# Patient Record
Sex: Male | Born: 1990 | Race: Black or African American | Hispanic: No | Marital: Single | Smoking: Current every day smoker
Health system: Southern US, Community
[De-identification: ages and names within clinical notes are randomized; demographics above are authoritative.]

---
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
## 2012-12-06 ENCOUNTER — Encounter (HOSPITAL_COMMUNITY): Payer: Self-pay | Admitting: *Deleted

## 2012-12-06 ENCOUNTER — Emergency Department (HOSPITAL_COMMUNITY): Payer: No Typology Code available for payment source

## 2012-12-06 ENCOUNTER — Emergency Department (HOSPITAL_COMMUNITY)
Admission: EM | Admit: 2012-12-06 | Discharge: 2012-12-06 | Disposition: A | Discharge status: Home / Self Care | Payer: Self-pay | Attending: Emergency Medicine | Admitting: Emergency Medicine

## 2012-12-06 DIAGNOSIS — Y9389 Activity, other specified: Secondary | ICD-10-CM | POA: Insufficient documentation

## 2012-12-06 DIAGNOSIS — Y92410 Unspecified street and highway as the place of occurrence of the external cause: Secondary | ICD-10-CM | POA: Insufficient documentation

## 2012-12-06 DIAGNOSIS — S8392XA Sprain of unspecified site of left knee, initial encounter: Secondary | ICD-10-CM

## 2012-12-06 DIAGNOSIS — IMO0002 Reserved for concepts with insufficient information to code with codable children: Secondary | ICD-10-CM | POA: Insufficient documentation

## 2012-12-06 MED ORDER — NAPROXEN 500 MG PO TABS: 500.0000 mg | ORAL_TABLET | Freq: Two times a day (BID) | ORAL | Status: AC
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
## 2012-12-07 NOTE — ED Provider Notes (Signed)
Medical screening examination/treatment/procedure(s) were performed by non-physician practitioner and as supervising physician I was immediately available for consultation/collaboration.   Shelda Jakes, MD 12/07/12 [id number]

## 2014-07-05 ENCOUNTER — Emergency Department (HOSPITAL_COMMUNITY)
Admission: EM | Admit: 2014-07-05 | Discharge: 2014-07-05 | Disposition: A | Discharge status: Home / Self Care | Payer: Self-pay | Attending: Emergency Medicine | Admitting: Emergency Medicine

## 2014-07-05 ENCOUNTER — Encounter (HOSPITAL_COMMUNITY): Payer: Self-pay

## 2014-07-05 DIAGNOSIS — Z791 Long term (current) use of non-steroidal anti-inflammatories (NSAID): Secondary | ICD-10-CM | POA: Insufficient documentation

## 2014-07-05 DIAGNOSIS — R369 Urethral discharge, unspecified: Secondary | ICD-10-CM

## 2014-07-05 DIAGNOSIS — Z720 Tobacco use: Secondary | ICD-10-CM | POA: Insufficient documentation

## 2014-07-05 LAB — URINALYSIS, ROUTINE W REFLEX MICROSCOPIC
Bilirubin Urine: NEGATIVE
Glucose, UA: NEGATIVE mg/dL
Hgb urine dipstick: NEGATIVE
Ketones, ur: NEGATIVE mg/dL
NITRITE: NEGATIVE
Protein, ur: NEGATIVE mg/dL
SPECIFIC GRAVITY, URINE: 1.01 (ref 1.005–1.030)
UROBILINOGEN UA: 0.2 mg/dL (ref 0.0–1.0)
pH: 6 (ref 5.0–8.0)

## 2014-07-05 LAB — URINE MICROSCOPIC-ADD ON

## 2014-07-05 MED ORDER — AZITHROMYCIN 250 MG PO TABS
1000.0000 mg | ORAL_TABLET | Freq: Once | ORAL | Status: AC
  Administered 2014-07-05: 1000 mg via ORAL
  Filled 2014-07-05: qty 4

## 2014-07-05 MED ORDER — CEFTRIAXONE SODIUM 250 MG IJ SOLR
250.0000 mg | Freq: Once | INTRAMUSCULAR | Status: AC
  Administered 2014-07-05: 250 mg via INTRAMUSCULAR
  Filled 2014-07-05: qty 250

## 2014-07-05 NOTE — ED Notes (Signed)
Pt reports white discharge from penis x 1 week.  Says hurts when urinates.

## 2014-07-05 NOTE — Discharge Instructions (Signed)
You were treated in the emergency department with intramuscular Rocephin, and oral Zithromax. Please refrain from all sexual activity for the next 7 days. Please follow-up at the health department if any changes, problems, or concerns.

## 2014-07-05 NOTE — ED Notes (Signed)
PA at bedside.

## 2014-07-05 NOTE — ED Provider Notes (Signed)
CSN: [id number]     Arrival date & time 07/05/14  0810 History   First MD Initiated Contact with Patient 07/05/14 [phone]     Chief Complaint  Patient presents with  . s74.5      (Consider location/radiation/quality/duration/timing/severity/associated sxs/prior Treatment) HPI Comments: Hx of unprotected intercourse.  Patient is a 24 y.o. male presenting with penile discharge. The history is provided by the patient.  Penile Discharge This is a new problem. The current episode started in the past 7 days. The problem occurs intermittently. The problem has been gradually worsening. Pertinent negatives include no abdominal pain, fever or myalgias. Associated symptoms comments: dysuria. Exacerbated by: dysuria. He has tried nothing for the symptoms. The treatment provided no relief.    History reviewed. No pertinent past medical history. History reviewed. No pertinent past surgical history. No family history on file. History  Substance Use Topics  . Smoking status: Current Every Day Smoker  . Smokeless tobacco: Not on file  . Alcohol Use: Yes     Comment: occ    Review of Systems  Constitutional: Negative for fever.  Gastrointestinal: Negative for abdominal pain.  Genitourinary: Positive for discharge.  Musculoskeletal: Negative for myalgias.  All other systems reviewed and are negative.     Allergies  Review of patient's allergies indicates no known allergies.  Home Medications   Prior to Admission medications   Medication Sig Start Date End Date Taking? Authorizing Provider  naproxen (NAPROSYN) 500 MG tablet Take 1 tablet (500 mg total) by mouth 2 (two) times daily. 12/06/12   Hope Orlene OchM Neese, NP   BP 133/88 mmHg  Pulse 62  Temp(Src) 97.9 F (36.6 C) (Oral)  Resp 18  Wt 150 lb (68.04 kg)  SpO2 99% Physical Exam  Constitutional: He is oriented to person, place, and time. He appears well-developed and well-nourished.  Non-toxic appearance.  HENT:  Head: Normocephalic.   Right Ear: Tympanic membrane and external ear normal.  Left Ear: Tympanic membrane and external ear normal.  Eyes: EOM and lids are normal. Pupils are equal, round, and reactive to light.  Neck: Normal range of motion. Neck supple. Carotid bruit is not present.  Cardiovascular: Normal rate, regular rhythm, normal heart sounds, intact distal pulses and normal pulses.   Pulmonary/Chest: Breath sounds normal. No respiratory distress.  Abdominal: Soft. Bowel sounds are normal. There is no tenderness. There is no guarding. Hernia confirmed negative in the right inguinal area and confirmed negative in the left inguinal area.  Genitourinary: Testes normal. Circumcised. No phimosis, paraphimosis or penile tenderness. No discharge found.  Musculoskeletal: Normal range of motion.  Lymphadenopathy:       Head (right side): No submandibular adenopathy present.       Head (left side): No submandibular adenopathy present.    He has no cervical adenopathy.       Right: No inguinal adenopathy present.       Left: No inguinal adenopathy present.  Neurological: He is alert and oriented to person, place, and time. He has normal strength. No cranial nerve deficit or sensory deficit.  Skin: Skin is warm and dry.  Psychiatric: He has a normal mood and affect. His speech is normal.  Nursing note and vitals reviewed.   ED Course  Procedures (including critical care time) Labs Review Labs Reviewed  URINALYSIS, ROUTINE W REFLEX MICROSCOPIC    Imaging Review No results found.   EKG Interpretation None      MDM  Vital signs are within normal limits. Patient  is nontoxic at this time. Patient is treated in the emergency department with intramuscular Rocephin, and oral Zithromax. Cultures have been sent to the lab, the patient is also been evaluated for HIV and syphilis.    Final diagnoses:  None    *I have reviewed nursing notes, vital signs, and all appropriate lab and imaging results for this  patient.    Ivery Quale, PA-C 07/06/14 1001  Donnetta Hutching, MD 07/06/14 1534

## 2014-07-06 LAB — RPR: RPR Ser Ql: NONREACTIVE

## 2014-07-06 LAB — HIV ANTIBODY (ROUTINE TESTING W REFLEX): HIV SCREEN 4TH GENERATION: NONREACTIVE

## 2014-07-06 LAB — GC/CHLAMYDIA PROBE AMP (~~LOC~~) NOT AT ARMC
CHLAMYDIA, DNA PROBE: NEGATIVE
NEISSERIA GONORRHEA: POSITIVE — AB

## 2014-07-09 ENCOUNTER — Telehealth (HOSPITAL_COMMUNITY): Payer: Self-pay

## 2014-07-09 NOTE — ED Notes (Signed)
Pt returned call. Verified ID. Informed of labs. Advised to abstain from sexual activity x 10 days and to notify partner(s) for testing and treatment.

## 2014-07-09 NOTE — ED Notes (Signed)
Positive for gonorrhea. Treated per protocol. Attempting to contact. [employer] form faxed
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
## 2016-02-14 ENCOUNTER — Encounter (HOSPITAL_COMMUNITY): Payer: Self-pay

## 2016-02-14 ENCOUNTER — Emergency Department (HOSPITAL_COMMUNITY)
Admission: EM | Admit: 2016-02-14 | Discharge: 2016-02-14 | Disposition: A | Discharge status: Home / Self Care | Payer: Self-pay | Attending: Emergency Medicine | Admitting: Emergency Medicine

## 2016-02-14 DIAGNOSIS — N342 Other urethritis: Secondary | ICD-10-CM | POA: Insufficient documentation

## 2016-02-14 DIAGNOSIS — F17210 Nicotine dependence, cigarettes, uncomplicated: Secondary | ICD-10-CM | POA: Insufficient documentation

## 2016-02-14 DIAGNOSIS — Z791 Long term (current) use of non-steroidal anti-inflammatories (NSAID): Secondary | ICD-10-CM | POA: Insufficient documentation

## 2016-02-14 MED ORDER — AZITHROMYCIN 250 MG PO TABS
1000.0000 mg | ORAL_TABLET | Freq: Once | ORAL | Status: AC
  Administered 2016-02-14: 1000 mg via ORAL
  Filled 2016-02-14: qty 4

## 2016-02-14 MED ORDER — LIDOCAINE HCL (PF) 1 % IJ SOLN
INTRAMUSCULAR | Status: AC
  Filled 2016-02-14: qty 5

## 2016-02-14 MED ORDER — CEFTRIAXONE SODIUM 250 MG IJ SOLR
250.0000 mg | Freq: Once | INTRAMUSCULAR | Status: AC
  Administered 2016-02-14: 250 mg via INTRAMUSCULAR
  Filled 2016-02-14: qty 250

## 2016-02-14 NOTE — ED Triage Notes (Signed)
Patient states that he has penile discharge that started several days ago.  Recently had unprotected oral sex, no recent unprotected sexual intercourse.

## 2016-02-14 NOTE — ED Notes (Signed)
Pt states he is having clear/white discharge for the past few days. Denies any pain or swelling. Pt states unprotected sex "awhile ago".

## 2016-02-14 NOTE — ED Provider Notes (Signed)
[facility]-EMERGENCY DEPT Provider Note   CSN: [id number] Arrival date & time: 02/14/16  [id number]     History   Chief Complaint Chief Complaint  Patient presents with  . Penile Discharge    HPI Dustin Morales is a 25 y.o. male.  HPI Patient reports new penile discharge of the past 2 days.  History of STD before in the past.  Continues to have intercourse without condoms.  No other complaints.  No abdominal pain.  Denies fevers or chills.  No nausea vomiting.   History reviewed. No pertinent past medical history.  There are no active problems to display for this patient.   History reviewed. No pertinent surgical history.     Home Medications    Prior to Admission medications   Medication Sig Start Date End Date Taking? Authorizing Provider  naproxen (NAPROSYN) 500 MG tablet Take 1 tablet (500 mg total) by mouth 2 (two) times daily. 12/06/12   Hope Orlene OchM Neese, NP    Family History No family history on file.  Social History Social History  Substance Use Topics  . Smoking status: Current Every Day Smoker    Packs/day: 1.00    Types: Cigarettes  . Smokeless tobacco: Never Used  . Alcohol use Yes     Comment: occ     Allergies   Patient has no known allergies.   Review of Systems Review of Systems  All other systems reviewed and are negative.    Physical Exam Updated Vital Signs BP 107/71 (BP Location: Right Arm)   Pulse 90   Temp 98.2 F (36.8 C) (Oral)   Resp 16   Ht 5\' 7"  (1.702 m)   Wt 145 lb (65.8 kg)   SpO2 100%   BMI 22.71 kg/m   Physical Exam  Constitutional: He is oriented to person, place, and time. He appears well-developed and well-nourished.  HENT:  Head: Normocephalic.  Eyes: EOM are normal.  Neck: Normal range of motion.  Pulmonary/Chest: Effort normal.  Abdominal: He exhibits no distension.  Genitourinary:  Genitourinary Comments: Normal circumcised penis.  No gross penile discharge.  No penile swelling  Musculoskeletal: Normal range  of motion.  Neurological: He is alert and oriented to person, place, and time.  Psychiatric: He has a normal mood and affect.  Nursing note and vitals reviewed.    ED Treatments / Results  Labs (all labs ordered are listed, but only abnormal results are displayed) Labs Reviewed  GC[facility]    EKG  EKG Interpretation None       Radiology No results found.  Procedures Procedures (including critical care time)  Medications Ordered in ED Medications  cefTRIAXone (ROCEPHIN) injection 250 mg (not administered)  azithromycin (ZITHROMAX) tablet 1,000 mg (not administered)     Initial Impression / Assessment and Plan / ED Course  I have reviewed the triage vital signs and the nursing notes.  Pertinent labs & imaging results that were available during my care of the patient were reviewed by me and considered in my medical decision making (see chart for details).  Clinical Course     Treated for gonorrhea and chlamydia for suspected urethritis.  Swab sent.  Instructed not to have intercourse until symptoms resolve.  Instructed to use condoms.  Told to tell all sexual partners about possible STD to be seen and evaluated at the health department and not the emergency department  Final Clinical Impressions(s) / ED Diagnoses   Final diagnoses:  Urethritis  New Prescriptions New Prescriptions   No medications on file     Azalia BilisKevin Wilian Kwong, MD 02/14/16 [phone]

## 2016-02-17 LAB — GC/CHLAMYDIA PROBE AMP (~~LOC~~) NOT AT ARMC
CHLAMYDIA, DNA PROBE: POSITIVE — AB
NEISSERIA GONORRHEA: NEGATIVE

## 2018-01-10 ENCOUNTER — Encounter (HOSPITAL_COMMUNITY): Payer: Self-pay | Admitting: Emergency Medicine

## 2018-01-10 ENCOUNTER — Emergency Department (HOSPITAL_COMMUNITY)
Admission: EM | Admit: 2018-01-10 | Discharge: 2018-01-10 | Disposition: A | Discharge status: Home / Self Care | Payer: Self-pay | Attending: Emergency Medicine | Admitting: Emergency Medicine

## 2018-01-10 ENCOUNTER — Other Ambulatory Visit: Payer: Self-pay

## 2018-01-10 DIAGNOSIS — F17210 Nicotine dependence, cigarettes, uncomplicated: Secondary | ICD-10-CM | POA: Insufficient documentation

## 2018-01-10 DIAGNOSIS — R369 Urethral discharge, unspecified: Secondary | ICD-10-CM | POA: Insufficient documentation

## 2018-01-10 DIAGNOSIS — Z79899 Other long term (current) drug therapy: Secondary | ICD-10-CM | POA: Insufficient documentation

## 2018-01-10 LAB — URINALYSIS, ROUTINE W REFLEX MICROSCOPIC
BILIRUBIN URINE: NEGATIVE
Bacteria, UA: NONE SEEN
GLUCOSE, UA: NEGATIVE mg/dL
Hgb urine dipstick: NEGATIVE
Ketones, ur: 20 mg/dL — AB
NITRITE: NEGATIVE
PH: 6 (ref 5.0–8.0)
Protein, ur: NEGATIVE mg/dL
SPECIFIC GRAVITY, URINE: 1.028 (ref 1.005–1.030)

## 2018-01-10 MED ORDER — AZITHROMYCIN 250 MG PO TABS
1000.0000 mg | ORAL_TABLET | Freq: Once | ORAL | Status: AC
  Administered 2018-01-10: 1000 mg via ORAL
  Filled 2018-01-10: qty 4

## 2018-01-10 MED ORDER — LIDOCAINE HCL (PF) 1 % IJ SOLN
INTRAMUSCULAR | Status: AC
  Administered 2018-01-10: 5 mL
  Filled 2018-01-10: qty 5

## 2018-01-10 MED ORDER — CEFTRIAXONE SODIUM 250 MG IJ SOLR
250.0000 mg | Freq: Once | INTRAMUSCULAR | Status: AC
  Administered 2018-01-10: 250 mg via INTRAMUSCULAR
  Filled 2018-01-10: qty 250

## 2018-01-10 NOTE — ED Triage Notes (Signed)
Patient reports cream colored discharge x 1 month.

## 2018-01-10 NOTE — ED Provider Notes (Signed)
[facility] EMERGENCY DEPARTMENT Provider Note   CSN: [id number] Arrival date & time: 01/10/18  1712     History   Chief Complaint Chief Complaint  Patient presents with  . SEXUALLY TRANSMITTED DISEASE    HPI Dustin Morales is a 27 y.o. male who presents with penile discharge.  Past medical history significant for gonorrhea.  Patient states that he had unprotected sex 2 months ago.  He has been having discharge for the past month and a half.  He denies any fever, abdominal pain, penis pain, testicular pain, vomiting or dysuria.  No rashes or lesions.  HPI  History reviewed. No pertinent past medical history.  There are no active problems to display for this patient.   History reviewed. No pertinent surgical history.      Home Medications    Prior to Admission medications   Medication Sig Start Date End Date Taking? Authorizing Provider  naproxen (NAPROSYN) 500 MG tablet Take 1 tablet (500 mg total) by mouth 2 (two) times daily. 12/06/12   Janne Napoleon, NP    Family History Family History  Problem Relation Age of Onset  . Cancer Other     Social History Social History   Tobacco Use  . Smoking status: Current Every Day Smoker    Packs/day: 0.50    Types: Cigarettes  . Smokeless tobacco: Never Used  Substance Use Topics  . Alcohol use: Yes    Alcohol/week: 14.0 standard drinks    Types: 14 Cans of beer per week  . Drug use: No     Allergies   Patient has no known allergies.   Review of Systems Review of Systems  Constitutional: Negative for fever.  Genitourinary: Positive for discharge. Negative for dysuria, flank pain, genital sores, hematuria, penile pain and testicular pain.     Physical Exam Updated Vital Signs BP 139/85 (BP Location: Right Arm)   Pulse 97   Temp 98.5 F (36.9 C) (Oral)   Ht 5\' 8"  (1.727 m)   Wt 74.8 kg   SpO2 100%   BMI 25.09 kg/m   Physical Exam  Constitutional: He is oriented to person, place, and time. He  appears well-developed and well-nourished. No distress.  HENT:  Head: Normocephalic and atraumatic.  Eyes: Pupils are equal, round, and reactive to light. Conjunctivae are normal. Right eye exhibits no discharge. Left eye exhibits no discharge. No scleral icterus.  Neck: Normal range of motion.  Cardiovascular: Normal rate.  Pulmonary/Chest: Effort normal. No respiratory distress.  Abdominal: He exhibits no distension.  Genitourinary:  Genitourinary Comments: No inguinal lymphadenopathy or inguinal hernia noted. Normal circumcised penis free of lesions or rash. Testicles are nontender with normal lie. Normal scrotal appearance. No obvious discharge noted. Chaperone Teacher, English as a foreign language, RN) present during exam.    Neurological: He is alert and oriented to person, place, and time.  Skin: Skin is warm and dry.  Psychiatric: He has a normal mood and affect. His behavior is normal.  Nursing note and vitals reviewed.    ED Treatments / Results  Labs (all labs ordered are listed, but only abnormal results are displayed) Labs Reviewed - No data to display  EKG None  Radiology No results found.  Procedures Procedures (including critical care time)  Medications Ordered in ED Medications - No data to display   Initial Impression / Assessment and Plan / ED Course  I have reviewed the triage vital signs and the nursing notes.  Pertinent labs & imaging results that were available  during my care of the patient were reviewed by me and considered in my medical decision making (see chart for details).  27 year old male presents with penile discharge for the past month and a half.  He has a history of STD and feels that it is similar.  His exam is unremarkable.  UA has 6-10 WBC. Will treat empirically for gonorrhea and chlamydia.  He states that he does not have time to get the HIV and syphilis testing. Advised safe sex practices and f/u with health dept.  Final Clinical Impressions(s) / ED Diagnoses     Final diagnoses:  Penile discharge    ED Discharge Orders    None       Bethel Born, PA-C 01/10/18 2007    Samuel Jester, DO 01/11/18 1527

## 2018-01-10 NOTE — Discharge Instructions (Signed)
Do not have sex for 2 weeks °Have all partners tested and treated °If your test is abnormal, you will be called but you have been treated for Gonorrhea and Chlamydia today. You can also review your results on MyChart °Practice safe sex and use a condom to prevent infection or unwanted pregnancy °Follow up with the Health Department ° °
